# Patient Record
Sex: Female | Born: 1970 | Race: White | Hispanic: No | Marital: Married | Smoking: Never smoker
Health system: Southern US, Community
[De-identification: ages and names within clinical notes are randomized; demographics above are authoritative.]

## PROBLEM LIST (undated history)

## (undated) DIAGNOSIS — A6000 Herpesviral infection of urogenital system, unspecified: Secondary | ICD-10-CM

## (undated) DIAGNOSIS — H9190 Unspecified hearing loss, unspecified ear: Secondary | ICD-10-CM

## (undated) DIAGNOSIS — E785 Hyperlipidemia, unspecified: Secondary | ICD-10-CM

## (undated) DIAGNOSIS — Z803 Family history of malignant neoplasm of breast: Secondary | ICD-10-CM

## (undated) DIAGNOSIS — F4321 Adjustment disorder with depressed mood: Secondary | ICD-10-CM

## (undated) DIAGNOSIS — K219 Gastro-esophageal reflux disease without esophagitis: Secondary | ICD-10-CM

## (undated) DIAGNOSIS — R569 Unspecified convulsions: Secondary | ICD-10-CM

## (undated) HISTORY — PX: PLANTAR FASCIA RELEASE: SHX2239

## (undated) HISTORY — DX: Family history of malignant neoplasm of breast: Z80.3

## (undated) HISTORY — DX: Gastro-esophageal reflux disease without esophagitis: K21.9

## (undated) HISTORY — DX: Unspecified hearing loss, unspecified ear: H91.90

## (undated) HISTORY — PX: FOOT NEUROMA SURGERY: SHX646

## (undated) HISTORY — DX: Hyperlipidemia, unspecified: E78.5

## (undated) HISTORY — PX: UPPER GASTROINTESTINAL ENDOSCOPY: SHX188

## (undated) HISTORY — PX: COLONOSCOPY: SHX174

## (undated) HISTORY — DX: Unspecified convulsions: R56.9

## (undated) HISTORY — DX: Adjustment disorder with depressed mood: F43.21

## (undated) HISTORY — PX: APPENDECTOMY: SHX54

## (undated) HISTORY — DX: Herpesviral infection of urogenital system, unspecified: A60.00

---

## 1998-02-02 ENCOUNTER — Other Ambulatory Visit: Admission: RE | Admit: 1998-02-02 | Discharge: 1998-02-02 | Payer: Self-pay | Admitting: Obstetrics & Gynecology

## 1999-02-04 ENCOUNTER — Other Ambulatory Visit: Admission: RE | Admit: 1999-02-04 | Discharge: 1999-02-04 | Payer: Self-pay | Admitting: Obstetrics & Gynecology

## 1999-11-25 ENCOUNTER — Other Ambulatory Visit: Admission: RE | Admit: 1999-11-25 | Discharge: 1999-11-25 | Payer: Self-pay | Admitting: Obstetrics and Gynecology

## 2000-07-03 ENCOUNTER — Inpatient Hospital Stay (HOSPITAL_COMMUNITY): Admission: AD | Admit: 2000-07-03 | Discharge: 2000-07-05 | Payer: Self-pay | Admitting: Obstetrics & Gynecology

## 2000-07-07 ENCOUNTER — Encounter: Admission: RE | Admit: 2000-07-07 | Discharge: 2000-07-17 | Payer: Self-pay | Admitting: Obstetrics and Gynecology

## 2002-08-13 ENCOUNTER — Other Ambulatory Visit: Admission: RE | Admit: 2002-08-13 | Discharge: 2002-08-13 | Payer: Self-pay | Admitting: Obstetrics and Gynecology

## 2003-09-08 ENCOUNTER — Other Ambulatory Visit: Admission: RE | Admit: 2003-09-08 | Discharge: 2003-09-08 | Payer: Self-pay | Admitting: Obstetrics and Gynecology

## 2004-09-29 ENCOUNTER — Other Ambulatory Visit: Admission: RE | Admit: 2004-09-29 | Discharge: 2004-09-29 | Payer: Self-pay | Admitting: Obstetrics and Gynecology

## 2004-10-21 ENCOUNTER — Ambulatory Visit (HOSPITAL_COMMUNITY): Admission: RE | Admit: 2004-10-21 | Discharge: 2004-10-21 | Payer: Self-pay | Admitting: Obstetrics and Gynecology

## 2005-12-13 ENCOUNTER — Other Ambulatory Visit: Admission: RE | Admit: 2005-12-13 | Discharge: 2005-12-13 | Payer: Self-pay | Admitting: Obstetrics and Gynecology

## 2007-04-17 ENCOUNTER — Ambulatory Visit (HOSPITAL_BASED_OUTPATIENT_CLINIC_OR_DEPARTMENT_OTHER): Admission: RE | Admit: 2007-04-17 | Discharge: 2007-04-17 | Payer: Self-pay | Admitting: Orthopedic Surgery

## 2009-03-13 ENCOUNTER — Ambulatory Visit: Payer: Self-pay | Admitting: Diagnostic Radiology

## 2009-03-13 ENCOUNTER — Observation Stay (HOSPITAL_COMMUNITY): Admission: EM | Admit: 2009-03-13 | Discharge: 2009-03-14 | Payer: Self-pay | Admitting: Emergency Medicine

## 2009-03-13 ENCOUNTER — Encounter: Payer: Self-pay | Admitting: Emergency Medicine

## 2009-03-14 ENCOUNTER — Encounter (INDEPENDENT_AMBULATORY_CARE_PROVIDER_SITE_OTHER): Payer: Self-pay | Admitting: General Surgery

## 2009-09-14 ENCOUNTER — Encounter: Admission: RE | Admit: 2009-09-14 | Discharge: 2009-09-14 | Payer: Self-pay | Admitting: Family Medicine

## 2009-09-24 ENCOUNTER — Other Ambulatory Visit: Admission: RE | Admit: 2009-09-24 | Discharge: 2009-09-24 | Payer: Self-pay | Admitting: Gastroenterology

## 2010-08-01 ENCOUNTER — Encounter: Payer: Self-pay | Admitting: Family Medicine

## 2010-10-15 LAB — URINALYSIS, ROUTINE W REFLEX MICROSCOPIC
Bilirubin Urine: NEGATIVE
Glucose, UA: NEGATIVE mg/dL
Ketones, ur: NEGATIVE mg/dL
Nitrite: NEGATIVE
Protein, ur: NEGATIVE mg/dL

## 2010-10-15 LAB — COMPREHENSIVE METABOLIC PANEL
ALT: 11 U/L (ref 0–35)
AST: 23 U/L (ref 0–37)
Albumin: 4.4 g/dL (ref 3.5–5.2)
Chloride: 105 mEq/L (ref 96–112)
Creatinine, Ser: 0.9 mg/dL (ref 0.4–1.2)
Glucose, Bld: 106 mg/dL — ABNORMAL HIGH (ref 70–99)
Potassium: 3.9 mEq/L (ref 3.5–5.1)
Sodium: 143 mEq/L (ref 135–145)

## 2010-10-15 LAB — CBC
HCT: 37.5 % (ref 36.0–46.0)
MCHC: 35.4 g/dL (ref 30.0–36.0)
MCV: 96.4 fL (ref 78.0–100.0)
Platelets: 273 10*3/uL (ref 150–400)
WBC: 10 10*3/uL (ref 4.0–10.5)

## 2010-10-15 LAB — LIPASE, BLOOD: Lipase: 86 U/L (ref 23–300)

## 2010-10-15 LAB — DIFFERENTIAL
Basophils Absolute: 0 10*3/uL (ref 0.0–0.1)
Basophils Relative: 0 % (ref 0–1)
Eosinophils Relative: 1 % (ref 0–5)
Monocytes Relative: 6 % (ref 3–12)
Neutrophils Relative %: 70 % (ref 43–77)

## 2010-10-15 LAB — URINE MICROSCOPIC-ADD ON

## 2010-10-15 LAB — PREGNANCY, URINE: Preg Test, Ur: NEGATIVE

## 2010-11-23 NOTE — Op Note (Signed)
NAMESHARMILA, WROBLESKI              ACCOUNT NO.:  [id number]   MEDICAL RECORD NO.:  [id number]          PATIENT TYPE:  AMB   LOCATION:  DSC                          FACILITY:  [facility]   PHYSICIAN:  Leonides Grills, M.D.     DATE OF BIRTH:  October 22, 1970   DATE OF PROCEDURE:  04/17/2007  DATE OF DISCHARGE:                               OPERATIVE REPORT   PREOPERATIVE DIAGNOSES:  1. Left chronic plantar fasciitis.  2. Left tight gastroc.   POSTOPERATIVE DIAGNOSES:  1. Left chronic plantar fasciitis.  2. Left tight gastroc.   OPERATION:  1. Left Topaz application plantar fascia.  2. Left gastroc slide.   ANESTHESIA:  General.   SURGEON:  Leonides Grills, MD   ASSISTANT:  Evlyn Kanner, P.A.   ESTIMATED BLOOD LOSS:  Minimal.   TOURNIQUET:  None.   COMPLICATIONS:  None.   DISPOSITION:  Stable to PR.   INDICATIONS:  This is a 40 year old female with longstanding left  plantar fasciitis that was resistant to conservative management.  She  was consented for the above procedure.  All risks which include  infection, nerve or vessel injury, persistent pain, worsening pain,  prolonged recovery, weakness, plantar fascial rupture were all  explained.  Questions were encouraged and answered.   OPERATION:  The patient brought to the operating room and placed in  supine position after adequate general endotracheal tube anesthesia was  administered as well as Ancef gram IV piggyback.  The left lower  extremity was then prepped and draped in a sterile manner.  No  tourniquet was used.  A longitudinal incision over the medial aspect  gastrocnemius musculotendinous junction was made.  Dissection was  carried down through skin.  Hemostasis was obtained.  Fascia was opened  in line with the incision.  Conjoined region was then developed between  the gastroc and soleus.  Soft tissue including the sural nerve then  elevated off the posterior aspect gastrocnemius.  The gastrocnemius then  released  with a curved Mayo scissors protecting sural nerve posteriorly.  This had excellent release.  Tight gastroc area was copiously irrigated  with normal saline.  Subcu was closed with 3-0 Vicryl.  Skin was closed  with 4-0 Monocryl subcuticular stitch.  Steri-Strips were applied.  We  then made a grid of 2 mm holes 5 mm apart over the area of maximal  tenderness over the medial tubercle of the left calcaneus.  This was a  grid of about 3 x 3 cm and through these holes a Topaz radiofrequency  wand was applied to the plantar fascia and the application was done to  the plantar fascia.  Once this was done sterile dressing was applied.  Cam walker boot was applied.  The patient was stable to PR.      Leonides Grills, M.D.  Electronically Signed     PB/MEDQ  D:  04/17/2007  T:  04/17/2007  Job:  [id number]

## 2010-11-26 NOTE — H&P (Signed)
[facility]  Patient:    Misty Hensley, Misty Hensley                       MRN: [id number] Adm. Date:  07/03/00 Attending:  Janine Limbo, M.D. Dictator:   Nigel Bridgeman, C.N.M.                         History and Physical  HISTORY OF PRESENT ILLNESS:   Ms. Vancleve is a 40 year old, gravida 1, para 0 at 39-6/7ths weeks who presents with uterine contractions all day, now q. 5 minutes x 2 hours.  Patient denies any leaking or bleeding.  She reports positive fetal movement.  Cervix has been 1 cm in the office.  Pregnancy has been remarkable for:  1. 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   2. 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  3. ASA allergy.  PRENATAL LABORATORIES:        Blood type is A positive, Rh antibody negative, VDRL nonreactive, rubella titer positive, hepatitis B surface antigen negative, HIV nonreactive.  Toxoplasmosis titers were negative.  GC and Chlamydia cultures were negative.  Pap was normal.  Glucose challenge was elevated.  Three-hour GGT was within normal limits.  AFP was normal. Hemoglobin upon entering the practice was 13.7.  It was 10.5 at 29 weeks. Group B strep culture was negative.  EDC of 07/04/00 was established by ultrasound at 17 weeks.  This was a discrepancy from he EDC of LMP OF 06/23/00, but this was confirmed by ultrasound.  HISTORY OF PRESENT PREGNANCY:                    Patient entered care at approximately 9 weeks, had a normal AFP.  AFP was repeated when [facility] was altered and it was still within normal limits.  She had an elevated one-hour GGT at 152 which was followed up with a three-hour GGT.  This was normal.  The rest of her pregnancy was uncomplicated.  OBSTETRICAL HISTORY:          Patient is a primigravida.  PAST MEDICAL HISTORY:         She was on Ortho-Novum 135 with her last pill in July.  She reports the usual childhood illnesses with a history of Chlamydia in 1988.  She has a history of HSV diagnosis in 1993.   She has a history of yeast infections.  She had epilepsy diagnosed at 40 years old but grew out of it at age 89.  MEDICATION ALLERGIES:         ASPIRIN.  She has hives but can take Advil.  FAMILY HISTORY:               Her father is on blood pressure medication.  Her paternal grandmother is on blood pressure medications.  Her sister has varicosities.  Her maternal grandmother had breast cancer.  Her maternal grandfather had lung cancer.  SOCIAL HISTORY:               Patient is married, is the father of the baby. He is involved and supportive.  His name is Verdon Cummins.  The patient is graduate educated and is employed as a Interior and spatial designer.  Her husband is also college educated. He is employed in [employer].  She is Caucasian, of the [employer].  She has been followed by the physician service at [facility].  She denies any alcohol, drug, or  tobacco use during this pregnancy.  PHYSICAL EXAMINATION:  HEENT:                        Within normal limits.  LUNGS:                        Bilateral breath sounds are clear.  HEART:                        Regular rate and rhythm without murmur.  BREASTS:                      Soft and nontender.  ABDOMEN:                      Fundal height is approximately 38 cm, estimated fetal weight 7-1/2 pounds.  Uterine contractions are every 5 to 6 minutes, of mild quality.  PELVIC:                       Cervix was examined initially with 2 cm, 80% vertex, at a -2 station, with an intact bag of water, with small show noted. After ambulation, her cervix is now 4 to 5 cm, 90% vertex, at a -1 station with bulging bag of water and positive bloody show.  Good heart rate is reactive with no decelerations.  EXTREMITIES:                  Deep tendon reflexes are 2+ without clonus. There is a trace edema noted.  IMPRESSION:                   1. Intrauterine pregnancy at 39-6/7 weeks.                               2. Early labor.                               3.  Negative group B streptococcus.                               4. History of herpes simplex virus with no                                  lesions noted at present.  PLAN:                         1. Admit to birthing suite per consult with                                  Dr. Janine Limbo as the attending                                  physician.                               2. Routine physician orders.  3. Dr. Stefano Gaul plans artificial rupture of                                  membranes at a later time p.r.n.                               4. Patient desires epidural when appropriate.                                  The patient was recommended to wait at this                                  time until contractions were more frequent                                  and intense. DD:  07/03/00 TD:  07/03/00 Job: 2013 [id number]

## 2011-09-01 ENCOUNTER — Other Ambulatory Visit: Payer: Self-pay | Admitting: Gastroenterology

## 2011-09-26 ENCOUNTER — Encounter (HOSPITAL_COMMUNITY)
Admission: RE | Disposition: A | Discharge status: Hospice | Payer: Self-pay | Source: Ambulatory Visit | Attending: Gastroenterology

## 2011-09-26 ENCOUNTER — Ambulatory Visit (HOSPITAL_COMMUNITY)
Admission: RE | Admit: 2011-09-26 | Discharge: 2011-09-26 | Disposition: A | Discharge status: Hospice | Payer: BC Managed Care – PPO | Source: Ambulatory Visit | Attending: Gastroenterology | Admitting: Gastroenterology

## 2011-09-26 DIAGNOSIS — R1310 Dysphagia, unspecified: Secondary | ICD-10-CM | POA: Insufficient documentation

## 2011-09-26 HISTORY — PX: ESOPHAGEAL MANOMETRY: SHX5429

## 2011-09-26 SURGERY — MANOMETRY, ESOPHAGUS
Anesthesia: Choice

## 2011-09-26 MED ORDER — LIDOCAINE VISCOUS 2 % MT SOLN
OROMUCOSAL | Status: AC
  Filled 2011-09-26: qty 15

## 2011-09-27 ENCOUNTER — Encounter (HOSPITAL_COMMUNITY): Payer: Self-pay

## 2011-09-27 ENCOUNTER — Encounter (HOSPITAL_COMMUNITY): Payer: Self-pay | Admitting: Gastroenterology

## 2014-04-07 HISTORY — PX: ESOPHAGOGASTRODUODENOSCOPY: SHX1529

## 2014-07-11 HISTORY — PX: OTHER SURGICAL HISTORY: SHX169

## 2015-05-30 HISTORY — PX: LAPAROSCOPIC HELLER MYOTOMY: SUR780

## 2017-01-17 ENCOUNTER — Emergency Department (HOSPITAL_BASED_OUTPATIENT_CLINIC_OR_DEPARTMENT_OTHER): Payer: BC Managed Care – PPO

## 2017-01-17 ENCOUNTER — Emergency Department (HOSPITAL_BASED_OUTPATIENT_CLINIC_OR_DEPARTMENT_OTHER)
Admission: EM | Admit: 2017-01-17 | Discharge: 2017-01-17 | Disposition: A | Discharge status: Home / Self Care | Payer: BC Managed Care – PPO | Attending: Emergency Medicine | Admitting: Emergency Medicine

## 2017-01-17 ENCOUNTER — Encounter (HOSPITAL_BASED_OUTPATIENT_CLINIC_OR_DEPARTMENT_OTHER): Payer: Self-pay | Admitting: Emergency Medicine

## 2017-01-17 DIAGNOSIS — R0789 Other chest pain: Secondary | ICD-10-CM | POA: Diagnosis not present

## 2017-01-17 DIAGNOSIS — R079 Chest pain, unspecified: Secondary | ICD-10-CM | POA: Diagnosis present

## 2017-01-17 DIAGNOSIS — R8290 Unspecified abnormal findings in urine: Secondary | ICD-10-CM | POA: Insufficient documentation

## 2017-01-17 LAB — COMPREHENSIVE METABOLIC PANEL
ALBUMIN: 4.2 g/dL (ref 3.5–5.0)
ALT: 23 U/L (ref 14–54)
ANION GAP: 8 (ref 5–15)
AST: 25 U/L (ref 15–41)
Alkaline Phosphatase: 43 U/L (ref 38–126)
BUN: 14 mg/dL (ref 6–20)
CHLORIDE: 104 mmol/L (ref 101–111)
CO2: 26 mmol/L (ref 22–32)
Calcium: 8.9 mg/dL (ref 8.9–10.3)
Creatinine, Ser: 0.86 mg/dL (ref 0.44–1.00)
GFR calc Af Amer: >60 mL/min (ref >60)
GFR calc non Af Amer: >60 mL/min (ref >60)
GLUCOSE: 102 mg/dL — AB (ref 65–99)
POTASSIUM: 3.7 mmol/L (ref 3.5–5.1)
SODIUM: 138 mmol/L (ref 135–145)
TOTAL PROTEIN: 7.1 g/dL (ref 6.5–8.1)
Total Bilirubin: 0.5 mg/dL (ref 0.3–1.2)

## 2017-01-17 LAB — CBC WITH DIFFERENTIAL/PLATELET
BASOS PCT: 0 %
Basophils Absolute: 0 10*3/uL (ref 0.0–0.1)
EOS ABS: 0.2 10*3/uL (ref 0.0–0.7)
Eosinophils Relative: 4 %
HCT: 40 % (ref 36.0–46.0)
Hemoglobin: 13.1 g/dL (ref 12.0–15.0)
Lymphocytes Relative: 34 %
Lymphs Abs: 1.8 10*3/uL (ref 0.7–4.0)
MCH: 32.4 pg (ref 26.0–34.0)
MCHC: 32.8 g/dL (ref 30.0–36.0)
MCV: 99 fL (ref 78.0–100.0)
MONO ABS: 0.5 10*3/uL (ref 0.1–1.0)
MONOS PCT: 9 %
Neutro Abs: 2.7 10*3/uL (ref 1.7–7.7)
Neutrophils Relative %: 53 %
PLATELETS: 262 10*3/uL (ref 150–400)
RBC: 4.04 MIL/uL (ref 3.87–5.11)
RDW: 12.7 % (ref 11.5–15.5)
WBC: 5.1 10*3/uL (ref 4.0–10.5)

## 2017-01-17 LAB — URINALYSIS, ROUTINE W REFLEX MICROSCOPIC
Bilirubin Urine: NEGATIVE
Glucose, UA: NEGATIVE mg/dL
Ketones, ur: NEGATIVE mg/dL
Nitrite: NEGATIVE
PROTEIN: NEGATIVE mg/dL
Specific Gravity, Urine: 1.019 (ref 1.005–1.030)
pH: 7 (ref 5.0–8.0)

## 2017-01-17 LAB — URINALYSIS, MICROSCOPIC (REFLEX)

## 2017-01-17 LAB — D-DIMER, QUANTITATIVE: D-Dimer, Quant: <0.27 ug/mL-FEU (ref 0.00–0.50)

## 2017-01-17 LAB — TROPONIN I: Troponin I: <0.03 ng/mL (ref <0.03)

## 2017-01-17 LAB — LIPASE, BLOOD: LIPASE: 28 U/L (ref 11–51)

## 2017-01-17 MED ORDER — KETOROLAC TROMETHAMINE 30 MG/ML IJ SOLN
60.0000 mg | Freq: Once | INTRAMUSCULAR | Status: DC
  Filled 2017-01-17: qty 2

## 2017-01-17 MED ORDER — KETOROLAC TROMETHAMINE 30 MG/ML IJ SOLN
30.0000 mg | Freq: Once | INTRAMUSCULAR | Status: AC
  Administered 2017-01-17: 30 mg via INTRAVENOUS

## 2017-01-17 MED ORDER — METHOCARBAMOL 500 MG PO TABS: 500.0000 mg | ORAL_TABLET | Freq: Three times a day (TID) | ORAL | 0 refills | Status: DC | PRN

## 2017-01-17 NOTE — Discharge Instructions (Addendum)
You may alternate Tylenol 1000 mg every 6 hours as needed for pain and Ibuprofen 800 mg every 8 hours as needed for pain.  Please take Ibuprofen with food. ° °

## 2017-01-17 NOTE — ED Provider Notes (Signed)
TIME SEEN: 4:16 AM  CHIEF COMPLAINT: Left chest pain  HPI: Patient is a 46 year old female with history of gallstones who presents emergency department with left lateral chest pain. States that she has had intermittent symptoms since June. States that pain was gone for several weeks and then came back last night. She states it woke her from sleep at 3 AM. Described as sharp in nature and worse with lying on that side, palpation, movement. She states it does make her feel she is short of breath like she cannot catch her breath. She denies any injury to this area or increased physical exertion. No fevers, cough, vomiting, diarrhea. No abdominal pain. No dysuria or hematuria. No history of PE or DVT. No lower extremity swelling or pain. No cardiac disease. Patient was seen by her primary care physician has had unremarkable labs including CBC, CMP, lipase, troponin and d-dimer. Also had an abdominal ultrasound which was unremarkable other than showing gallstones. She is not having a right upper quadrant abdominal pain. She does not feel that this changes with eating food. No rash or other lesions noted to this area.  ROS: See HPI Constitutional: no fever  Eyes: no drainage  ENT: no runny nose   Cardiovascular:   chest pain  Resp:  SOB  GI: no vomiting GU: no dysuria Integumentary: no rash  Allergy: no hives  Musculoskeletal: no leg swelling  Neurological: no slurred speech ROS otherwise negative  PAST MEDICAL HISTORY/PAST SURGICAL HISTORY:  History reviewed. No pertinent past medical history.  MEDICATIONS:  Prior to Admission medications   Not on File    ALLERGIES:  Allergies  Allergen Reactions  . Aspirin Other (See Comments)    "I get itchy on my back"    SOCIAL HISTORY:  Social History  Substance Use Topics  . Smoking status: Never Smoker  . Smokeless tobacco: Never Used  . Alcohol use No    FAMILY HISTORY: No family history on file.  EXAM: BP (!) 150/91 (BP Location:  Right Arm)   Pulse 64   Temp 98.2 F (36.8 C) (Oral)   Resp 19   Ht 5\' 4"  (1.626 m)   Wt 67.1 kg (148 lb)   SpO2 100%   BMI 25.40 kg/m  CONSTITUTIONAL: Alert and oriented and responds appropriately to questions. Well-appearing; well-nourished HEAD: Normocephalic EYES: Conjunctivae clear, pupils appear equal, EOMI ENT: normal nose; moist mucous membranes NECK: Supple, no meningismus, no nuchal rigidity, no LAD  CARD: RRR; S1 and S2 appreciated; no murmurs, no clicks, no rubs, no gallops CHEST:  Chest wall is tender to palpation along the left lateral lower ribs.  No crepitus, ecchymosis, erythema, warmth, rash or other lesions present.   RESP: Normal chest excursion without splinting or tachypnea; breath sounds clear and equal bilaterally; no wheezes, no rhonchi, no rales, no hypoxia or respiratory distress, speaking full sentences ABD/GI: Normal bowel sounds; non-distended; soft, non-tender, no rebound, no guarding, no peritoneal signs, no hepatosplenomegaly, negative Murphy sign BACK:  The back appears normal and is non-tender to palpation, there is no CVA tenderness EXT: Normal ROM in all joints; non-tender to palpation; no edema; normal capillary refill; no cyanosis, no calf tenderness or swelling    SKIN: Normal color for age and race; warm; no rash NEURO: Moves all extremities equally PSYCH: The patient's mood and manner are appropriate. Grooming and personal hygiene are appropriate.  MEDICAL DECISION MAKING: Patient here with left lateral chest wall pain. Seems to be musculoskeletal in nature but given complaints  of feeling short of breath, will obtain cardiac labs, d-dimer and chest x-ray. We'll also obtain EKG. We'll give Toradol for pain control. She did drive herself to the emergency department. Discussed with her that I feel this is unlikely her gallstones causing her symptoms and she agrees. Is not associated with food and she has no tenderness in the right upper quadrant and has  a negative Murphy sign. She has no other abdominal tenderness on exam. I do not feel she needs a CT of her abdomen and pelvis at this time. We will check LFTs and lipase however today. We'll also obtain urinalysis given she does have some component of left flank pain but doubt kidney stone, pyelonephritis given pain is reproducible with palpation.  ED PROGRESS: 6:00 AM  Pt's labs are unremarkable including normal LFTs, lipase, troponin and negative d-dimer. Chest x-ray shows no acute abnormality. No rib fractures. Pain improved with Toradol. She declines any further pain medication. Urinalysis shows trace hemoglobin and small leukocytes and many bacteria but 0-5 rbc's, 0-5 white blood cells. She is not having dysuria, urgency, frequency and no flank pain. Pain seems to be more of the lateral left chest wall. I did send a urine culture but I do not think this is the cause of her symptoms. I think that this is musculoskeletal in nature. Have recommended that she alternate Tylenol and Motrin for pain and will discharge with prescription for Robaxin. Recommended close follow-up with her primary care provider. If patient is comfortable with this plan.   At this time, I do not feel there is any life-threatening condition present. I have reviewed and discussed all results (EKG, imaging, lab, urine as appropriate) and exam findings with patient/family. I have reviewed nursing notes and appropriate previous records.  I feel the patient is safe to be discharged home without further emergent workup and can continue workup as an outpatient as needed. Discussed usual and customary return precautions. Patient/family verbalize understanding and are comfortable with this plan.  Outpatient follow-up has been provided if needed. All questions have been answered.     EKG Interpretation  Date/Time:  Tuesday January 17 2017 04:34:25 EDT Ventricular Rate:  64 PR Interval:    QRS Duration: 91 QT Interval:  422 QTC  Calculation: 436 R Axis:   8 Text Interpretation:  Sinus rhythm Low voltage, precordial leads Confirmed by Ward, Cyril Mourning [phone]) on 01/17/2017 5:03:01 AM         Ward, Delice Bison, DO 01/17/17 [id number]

## 2017-01-17 NOTE — ED Triage Notes (Signed)
L sided rib pain since last night. Makes breathing painful, but denies SOB. Pt states has happened before and u/s showed gall stones.

## 2017-01-18 LAB — URINE CULTURE

## 2019-06-17 ENCOUNTER — Encounter: Payer: Self-pay | Admitting: Gastroenterology

## 2019-07-11 ENCOUNTER — Telehealth (INDEPENDENT_AMBULATORY_CARE_PROVIDER_SITE_OTHER): Payer: BC Managed Care – PPO | Admitting: Gastroenterology

## 2019-07-11 ENCOUNTER — Other Ambulatory Visit: Payer: Self-pay

## 2019-07-11 ENCOUNTER — Encounter: Payer: Self-pay | Admitting: Gastroenterology

## 2019-07-11 VITALS — Ht 64.5 in | Wt 149.0 lb

## 2019-07-11 DIAGNOSIS — Z1211 Encounter for screening for malignant neoplasm of colon: Secondary | ICD-10-CM

## 2019-07-11 DIAGNOSIS — K220 Achalasia of cardia: Secondary | ICD-10-CM

## 2019-07-11 DIAGNOSIS — Z1159 Encounter for screening for other viral diseases: Secondary | ICD-10-CM

## 2019-07-11 DIAGNOSIS — R1310 Dysphagia, unspecified: Secondary | ICD-10-CM

## 2019-07-11 MED ORDER — CLENPIQ 10-3.5-12 MG-GM -GM/160ML PO SOLN: 1.0000 | Freq: Once | ORAL | 0 refills | Status: AC

## 2019-07-11 MED ORDER — ESOMEPRAZOLE MAGNESIUM 40 MG PO CPDR: 40.0000 mg | DELAYED_RELEASE_CAPSULE | Freq: Every day | ORAL | 6 refills | Status: AC

## 2019-07-11 NOTE — Progress Notes (Signed)
Chief Complaint: Dysphagia/screening  Referring Provider:  Berkley Harvey, NP      ASSESSMENT AND PLAN;   #1.  H/O type II achalasia s/p Lap Heller's myotomy with partial Princess Perna) fundoplication Jan [id number] (Dr Garner Nash), now with occ dysphagia and GERD.  #2.  Colorectal cancer screening.  Plan: -Nexium 40 mg p.o. once a day, 30, 6 refills -Ba swallow with barium tablet. (If timed barium swallow can be done, that would be great) -EGD/colonoscopy with Clenpiq at [facility].  I have discussed risks and benefits.  She wishes to proceed.   HPI:    Misty Hensley is a 48 y.o. female  For age based colorectal cancer screening Also has been having dysphagia mostly to solids-mostly to rice, quinoa, khus-khus. Mid-chest. Has been having occasional heartburn.  As below she has history of dysphagia, diagnosed with type II achalasia and underwent Heller's myotomy with partial fundoplication [id number].   No nausea, vomiting, regurgitation, odynophagia. No significant diarrhea or constipation.  No melena or hematochezia. No unintentional weight loss. No abdominal pain.  On review of previous records: Had dysphagia, EGD 04/11/2014 at [facility]: Distal eso stric s/p dil 32mm with negative eso Bx for EoE. Eso manometry 04/2014: Type II achalasia. Mean IRP 41.5 mm.  Past Medical History:  Diagnosis Date  . Adjustment disorder with depressed mood   . Family history of breast cancer in mother   . Genital herpes simplex   . GERD (gastroesophageal reflux disease)   . Hearing loss     Past Surgical History:  Procedure Laterality Date  . achalasia surgery  2016  . APPENDECTOMY    . ESOPHAGEAL MANOMETRY  09/26/2011   Procedure: ESOPHAGEAL MANOMETRY (EM);  Surgeon: Lear Ng, MD;  Location: [facility];  Service: Endoscopy;  Laterality: N/A;  . ESOPHAGOGASTRODUODENOSCOPY  04/07/2014   [facility]  . FOOT NEUROMA SURGERY    . LAPAROSCOPIC HELLER MYOTOMY  05/30/2015  . PLANTAR FASCIA  RELEASE Bilateral     Family History  Problem Relation Age of Onset  . Breast cancer Mother 1  . Coronary artery disease Father   . Hyperlipidemia Father   . Breast cancer Maternal Grandmother 39  . Hyperlipidemia Paternal Grandfather   . Colon cancer Neg Hx   . Esophageal cancer Neg Hx     Social History   Tobacco Use  . Smoking status: Never Smoker  . Smokeless tobacco: Never Used  Substance Use Topics  . Alcohol use: No  . Drug use: No    Current Outpatient Medications  Medication Sig Dispense Refill  . ibuprofen (ADVIL) 600 MG tablet Take 600 mg by mouth as needed.    . Multiple Vitamin (MULTIVITAMIN) tablet Take 1 tablet by mouth daily.     No current facility-administered medications for this visit.    Allergies  Allergen Reactions  . Aspirin Other (See Comments)    "I get itchy on my back"  . Penicillins Diarrhea    Review of Systems:  Constitutional: Denies fever, chills, diaphoresis, appetite change and fatigue.  HEENT: Denies photophobia, eye pain, redness, hearing loss, ear pain, congestion, sore throat, rhinorrhea, sneezing, mouth sores, neck pain, neck stiffness and tinnitus.   Respiratory: Denies SOB, DOE, cough, chest tightness,  and wheezing.   Cardiovascular: Denies chest pain, palpitations and leg swelling.  Genitourinary: Denies dysuria, urgency, frequency, hematuria, flank pain and difficulty urinating.  Musculoskeletal: Denies myalgias, back pain, joint swelling, arthralgias and gait problem.  Skin: No rash.  Neurological: Denies dizziness,  seizures, syncope, weakness, light-headedness, numbness and headaches.  Hematological: Denies adenopathy. Easy bruising, personal or family bleeding history  Psychiatric/Behavioral: No anxiety or depression     Physical Exam:    Ht 5' 4.5" (1.638 m)   Wt 149 lb (67.6 kg)   BMI 25.18 kg/m  Wt Readings from Last 3 Encounters:  07/11/19 149 lb (67.6 kg)  01/17/17 148 lb (67.1 kg)  09/26/11 (P) 145 lb  (65.8 kg)   Constitutional:  Well-developed, in no acute distress. Psychiatric: Normal mood and affect. Behavior is normal. televisit  Data Reviewed: I have personally reviewed following labs and imaging studies  CBC: CBC Latest Ref Rng & Units 01/17/2017 03/13/2009 04/17/2007  WBC 4.0 - 10.5 K/uL 5.1 10.0 -  Hemoglobin 12.0 - 15.0 g/dL 13.1 13.3 13.8  Hematocrit 36.0 - 46.0 % 40.0 37.5 -  Platelets 150 - 400 K/uL 262 273 -    CMP: CMP Latest Ref Rng & Units 01/17/2017 03/13/2009  Glucose 65 - 99 mg/dL 102(H) 106(H)  BUN 6 - 20 mg/dL 14 16  Creatinine 0.44 - 1.00 mg/dL 0.86 0.9  Sodium 135 - 145 mmol/L 138 143  Potassium 3.5 - 5.1 mmol/L 3.7 3.9  Chloride 101 - 111 mmol/L 104 105  CO2 22 - 32 mmol/L 26 29  Calcium 8.9 - 10.3 mg/dL 8.9 9.5  Total Protein 6.5 - 8.1 g/dL 7.1 7.8  Total Bilirubin 0.3 - 1.2 mg/dL 0.5 0.3  Alkaline Phos 38 - 126 U/L 43 60  AST 15 - 41 U/L 25 23  ALT 14 - 54 U/L 23 11  Extensive records were reviewed from care everywhere.  Total time spent on call, review of records: 40 min  I connected with  Misty Hensley on 07/13/19 by a video enabled telemedicine application and verified that I am speaking with the correct person.   I discussed the limitations of evaluation and management by telemedicine. The patient expressed understanding and agreed to proceed.    Carmell Austria, MD 07/11/2019, 10:40 AM  Cc: Berkley Harvey, NP

## 2019-07-11 NOTE — Patient Instructions (Addendum)
If you are age 50 or older, your body mass index should be between 23-30. Your Body mass index is 25.18 kg/m. If this is out of the aforementioned range listed, please consider follow up with your Primary Care Provider.  If you are age 31 or younger, your body mass index should be between 19-25. Your Body mass index is 25.18 kg/m. If this is out of the aformentioned range listed, please consider follow up with your Primary Care Provider.   You have been scheduled for a Barium Esophogram at [facility] (1st floor of the hospital) on 07/22/19 at 9:30am. Please arrive 15 minutes prior to your appointment for registration. Make certain not to have anything to eat or drink 3 hours prior to your test. If you need to reschedule for any reason, please contact radiology at [phone] to do so. __________________________________________________________________ A barium swallow is an examination that concentrates on views of the esophagus. This tends to be a double contrast exam (barium and two liquids which, when combined, create a gas to distend the wall of the oesophagus) or single contrast (non-ionic iodine based). The study is usually tailored to your symptoms so a good history is essential. Attention is paid during the study to the form, structure and configuration of the esophagus, looking for functional disorders (such as aspiration, dysphagia, achalasia, motility and reflux) EXAMINATION You may be asked to change into a gown, depending on the type of swallow being performed. A radiologist and radiographer will perform the procedure. The radiologist will advise you of the type of contrast selected for your procedure and direct you during the exam. You will be asked to stand, sit or lie in several different positions and to hold a small amount of fluid in your mouth before being asked to swallow while the imaging is performed .In some instances you may be asked to swallow barium coated marshmallows  to assess the motility of a solid food bolus. The exam can be recorded as a digital or video fluoroscopy procedure. POST PROCEDURE It will take 1-2 days for the barium to pass through your system. To facilitate this, it is important, unless otherwise directed, to increase your fluids for the next 24-48hrs and to resume your normal diet.  This test typically takes about 30 minutes to perform. __________________________________________________________________________________  We have sent the following medications to your pharmacy for you to pick up at your convenience: Nexium Clenpiq  You have been scheduled for an endoscopy and colonoscopy. Please follow the written instructions given to you at your visit today. Please pick up your prep supplies at the pharmacy within the next 1-3 days. If you use inhalers (even only as needed), please bring them with you on the day of your procedure. Your physician has requested that you go to www.startemmi.com and enter the access code given to you at your visit today. This web site gives a general overview about your procedure. However, you should still follow specific instructions given to you by our office regarding your preparation for the procedure.  I have attached a Pre Procedure Patient Acknowledgement form and a prepaid envelope, please initial and sign form and mail back in envelope.    Thank you,  Dr. Jackquline Denmark

## 2019-07-22 ENCOUNTER — Ambulatory Visit (HOSPITAL_COMMUNITY): Payer: BC Managed Care – PPO

## 2019-07-24 ENCOUNTER — Ambulatory Visit (HOSPITAL_COMMUNITY)
Admission: RE | Admit: 2019-07-24 | Discharge: 2019-07-24 | Disposition: A | Discharge status: Home / Self Care | Payer: BC Managed Care – PPO | Source: Ambulatory Visit | Attending: Gastroenterology | Admitting: Gastroenterology

## 2019-07-24 ENCOUNTER — Other Ambulatory Visit: Payer: Self-pay

## 2019-07-24 DIAGNOSIS — R1310 Dysphagia, unspecified: Secondary | ICD-10-CM | POA: Diagnosis present

## 2019-07-24 DIAGNOSIS — K220 Achalasia of cardia: Secondary | ICD-10-CM | POA: Insufficient documentation

## 2019-07-30 ENCOUNTER — Encounter: Payer: Self-pay | Admitting: Gastroenterology

## 2019-08-09 ENCOUNTER — Ambulatory Visit (INDEPENDENT_AMBULATORY_CARE_PROVIDER_SITE_OTHER): Payer: Self-pay

## 2019-08-09 ENCOUNTER — Other Ambulatory Visit: Payer: Self-pay | Admitting: Gastroenterology

## 2019-08-09 ENCOUNTER — Other Ambulatory Visit: Payer: Self-pay

## 2019-08-09 DIAGNOSIS — Z1159 Encounter for screening for other viral diseases: Secondary | ICD-10-CM

## 2019-08-12 LAB — SARS CORONAVIRUS 2 (TAT 6-24 HRS): SARS Coronavirus 2: NEGATIVE

## 2019-08-13 ENCOUNTER — Other Ambulatory Visit: Payer: Self-pay

## 2019-08-13 ENCOUNTER — Encounter: Payer: Self-pay | Admitting: Gastroenterology

## 2019-08-13 ENCOUNTER — Ambulatory Visit (AMBULATORY_SURGERY_CENTER): Payer: BC Managed Care – PPO | Admitting: Gastroenterology

## 2019-08-13 VITALS — BP 118/73 | HR 64 | Temp 96.8°F | Resp 10 | Ht 64.0 in | Wt 149.0 lb

## 2019-08-13 DIAGNOSIS — R1319 Other dysphagia: Secondary | ICD-10-CM

## 2019-08-13 DIAGNOSIS — K219 Gastro-esophageal reflux disease without esophagitis: Secondary | ICD-10-CM | POA: Diagnosis not present

## 2019-08-13 DIAGNOSIS — D124 Benign neoplasm of descending colon: Secondary | ICD-10-CM

## 2019-08-13 DIAGNOSIS — R1310 Dysphagia, unspecified: Secondary | ICD-10-CM

## 2019-08-13 DIAGNOSIS — D125 Benign neoplasm of sigmoid colon: Secondary | ICD-10-CM | POA: Diagnosis not present

## 2019-08-13 DIAGNOSIS — K317 Polyp of stomach and duodenum: Secondary | ICD-10-CM

## 2019-08-13 DIAGNOSIS — Z1211 Encounter for screening for malignant neoplasm of colon: Secondary | ICD-10-CM

## 2019-08-13 DIAGNOSIS — K2950 Unspecified chronic gastritis without bleeding: Secondary | ICD-10-CM

## 2019-08-13 MED ORDER — SODIUM CHLORIDE 0.9 % IV SOLN: 500.0000 mL | Freq: Once | INTRAVENOUS | Status: DC

## 2019-08-13 NOTE — Op Note (Signed)
[facility] Patient Name: Misty Hensley Procedure Date: 08/13/2019 7:18 AM MRN: [id number] Endoscopist: Jackquline Denmark , MD Age: 49 Referring MD:  Date of Birth: 10/29/1970 Gender: Female Account #: [id number] Procedure:                Colonoscopy Indications:              Screening for colorectal malignant neoplasm Medicines:                Monitored Anesthesia Care Procedure:                Pre-Anesthesia Assessment:                           - Prior to the procedure, a History and Physical                            was performed, and patient medications and                            allergies were reviewed. The patient's tolerance of                            previous anesthesia was also reviewed. The risks                            and benefits of the procedure and the sedation                            options and risks were discussed with the patient.                            All questions were answered, and informed consent                            was obtained. Prior Anticoagulants: The patient has                            taken no previous anticoagulant or antiplatelet                            agents. ASA Grade Assessment: I - A normal, healthy                            patient. After reviewing the risks and benefits,                            the patient was deemed in satisfactory condition to                            undergo the procedure.                           After obtaining informed consent, the colonoscope  was passed under direct vision. Throughout the                            procedure, the patient's blood pressure, pulse, and                            oxygen saturations were monitored continuously. The                            Colonoscope was introduced through the anus and                            advanced to the 2 cm into the ileum. The                            Colonoscope was introduced through the  and advanced                            to the. The colonoscopy was performed without                            difficulty. The patient tolerated the procedure                            well. The quality of the bowel preparation was                            good. The terminal ileum, ileocecal valve,                            appendiceal orifice, and rectum were photographed. Scope In: 8:17:48 AM Scope Out: 8:33:02 AM Scope Withdrawal Time: 0 hours 11 minutes 37 seconds  Total Procedure Duration: 0 hours 15 minutes 14 seconds  Findings:                 A 2 mm polyp was found in the proximal descending                            colon. The polyp was sessile. The polyp was removed                            with a cold biopsy forceps. Resection and retrieval                            were complete.                           A 10 mm polyp was found in the mid sigmoid colon.                            The polyp was semi-pedunculated. The polyp was  removed with a hot snare. Resection and retrieval                            were complete.                           A few small-mouthed diverticula were found in the                            sigmoid colon and ascending colon. More so in the                            ascending colon. No endoscopic evidence of                            diverticulitis.                           Non-bleeding internal hemorrhoids were found during                            retroflexion. The hemorrhoids were small.                           The exam was otherwise without abnormality on                            direct and retroflexion views.                           The terminal ileum appeared normal. Complications:            No immediate complications. Estimated Blood Loss:     Estimated blood loss: none. Impression:               -Colonic polyps s/p polypectomy.                           -Mild pancolonic diverticulosis  predominantly in                            the right colon.                           -Otherwise normal colonoscopy to TI. Recommendation:           - Patient has a contact number available for                            emergencies. The signs and symptoms of potential                            delayed complications were discussed with the                            patient. Return to normal activities tomorrow.  Written discharge instructions were provided to the                            patient.                           - Resume previous diet.                           - Continue present medications.                           - Await pathology results.                           - Repeat colonoscopy for surveillance based on                            pathology results.                           - No ibuprofen, naproxen, or other non-steroidal                            anti-inflammatory drugs for 5 days after polyp                            removal. Jackquline Denmark, MD 08/13/2019 8:41:29 AM This report has been signed electronically.

## 2019-08-13 NOTE — Op Note (Signed)
[place] Patient Name: Misty Hensley Procedure Date: 08/13/2019 7:19 AM MRN: [id number] Endoscopist: Jackquline Denmark , MD Age: 49 Referring MD:  Date of Birth: 1971/02/27 Gender: Female Account #: [id number] Procedure:                Upper GI endoscopy Indications:              H/O type II achalasia s/p Lap Heller's myotomy with                            partial Princess Perna) fundoplication Jan [id number] (Dr                            Garner Nash), now with occ dysphagia and GERD. Neg                            barium swallow. Medicines:                Monitored Anesthesia Care Procedure:                Pre-Anesthesia Assessment:                           - Prior to the procedure, a History and Physical                            was performed, and patient medications and                            allergies were reviewed. The patient's tolerance of                            previous anesthesia was also reviewed. The risks                            and benefits of the procedure and the sedation                            options and risks were discussed with the patient.                            All questions were answered, and informed consent                            was obtained. Prior Anticoagulants: The patient has                            taken no previous anticoagulant or antiplatelet                            agents. ASA Grade Assessment: I - A normal, healthy                            patient. After reviewing the risks and benefits,  the patient was deemed in satisfactory condition to                            undergo the procedure.                           - Prior to the procedure, a History and Physical                            was performed, and patient medications and                            allergies were reviewed. The patient's tolerance of                            previous anesthesia was also reviewed. The risks             and benefits of the procedure and the sedation                            options and risks were discussed with the patient.                            All questions were answered, and informed consent                            was obtained. Prior Anticoagulants: The patient has                            taken no previous anticoagulant or antiplatelet                            agents. ASA Grade Assessment: I - A normal, healthy                            patient. After reviewing the risks and benefits,                            the patient was deemed in satisfactory condition to                            undergo the procedure.                           After obtaining informed consent, the endoscope was                            passed under direct vision. Throughout the                            procedure, the patient's blood pressure, pulse, and                            oxygen saturations were monitored continuously. The  Endoscope was introduced through the mouth, and                            advanced to the second part of duodenum. The upper                            GI endoscopy was accomplished without difficulty.                            The patient tolerated the procedure well. Scope In: Scope Out: Findings:                 The examined esophagus was mildly tortuous but                            normal. It was not dilated. Z-line was well-defined                            at 36 cm. A small salmon-colored island measuring 6                            mm was visualized just proximal to the GE junction.                            Best visualized by NBI. Biopsies were taken with a                            cold forceps for histology, directed by NBI. The GE                            junction was normal. Evidence of previous Heller's                            myotomy by history. It was decided, however, to                             proceed with dilation of the entire esophagus. The                            scope was withdrawn. Dilation was performed with a                            Maloney dilator with mild resistance at 50 Fr.                           Localized mild inflammation characterized by                            erythema was found in the gastric antrum. Biopsies                            were taken with a cold forceps for histology. The  retroflexed examination of cardia did reveal                            presence of partial fundoplication.                           Two 2 to 6 mm sessile polyps with no bleeding and                            no stigmata of recent bleeding were found in the                            gastric body. The polyp was removed with a cold                            biopsy forceps. Resection and retrieval were                            complete.                           The examined duodenum was normal. Complications:            No immediate complications. Estimated Blood Loss:     Estimated blood loss: none. Impression:               -Patent GE junction.                           -H/O achalasia s/p Heller's myotomy with partial                            fundoplication with dysphagia s/p empiric                            esophageal dilatation.                           -Incidental gastric polyps s/p polypectomy.                           -Mild gastritis. Recommendation:           - Patient has a contact number available for                            emergencies. The signs and symptoms of potential                            delayed complications were discussed with the                            patient. Return to normal activities tomorrow.                            Written discharge instructions were provided to the  patient.                           - Post dilatation diet.                           - Continue  Nexium 40 mg p.o. once a day.                           - Await pathology results.                           - Return to GI clinic in 12 weeks. If still with                            problems, will perform further work-up. Jackquline Denmark, MD 08/13/2019 8:51:34 AM This report has been signed electronically.

## 2019-08-13 NOTE — Progress Notes (Signed)
To PACU, VSS. Report to [email or web address] 

## 2019-08-13 NOTE — Patient Instructions (Signed)
Thank you for letting us take care of your healthcare needs today.  Please see handouts given to you on Diverticulosis, Polyps and the Post Dilation diet. No NSAIDS for 5 days. Continue Nexium 40 mg daily.     YOU HAD AN ENDOSCOPIC PROCEDURE TODAY AT [facility]:   Refer to the procedure report that was given to you for any specific questions about what was found during the examination.  If the procedure report does not answer your questions, please call your gastroenterologist to clarify.  If you requested that your care partner not be given the details of your procedure findings, then the procedure report has been included in a sealed envelope for you to review at your convenience later.  YOU SHOULD EXPECT: Some feelings of bloating in the abdomen. Passage of more gas than usual.  Walking can help get rid of the air that was put into your GI tract during the procedure and reduce the bloating. If you had a lower endoscopy (such as a colonoscopy or flexible sigmoidoscopy) you may notice spotting of blood in your stool or on the toilet paper. If you underwent a bowel prep for your procedure, you may not have a normal bowel movement for a few days.  Please Note:  You might notice some irritation and congestion in your nose or some drainage.  This is from the oxygen used during your procedure.  There is no need for concern and it should clear up in a day or so.  SYMPTOMS TO REPORT IMMEDIATELY:   Following lower endoscopy (colonoscopy or flexible sigmoidoscopy):  Excessive amounts of blood in the stool    Significant tenderness or worsening of abdominal pains  Swelling of the abdomen that is new, acute  Fever of 100F or higher   Following upper endoscopy (EGD)  Vomiting of blood or coffee ground material  New chest pain or pain under the shoulder blades  Painful or persistently difficult swallowing  New shortness of breath  Fever of 100F or higher  Black, tarry-looking  stools  For urgent or emergent issues, a gastroenterologist can be reached at any hour by calling [phone].   DIET:  Clear liquids for an hour to start at 9:45, Soft diet to start at 10:45 [place]. TOMORROW  you may proceed to your regular diet.  Drink plenty of fluids but you should avoid alcoholic beverages for 24 hours.  ACTIVITY:  You should plan to take it easy for the rest of today and you should NOT DRIVE or use heavy machinery until tomorrow (because of the sedation medicines used during the test).    FOLLOW UP: Our staff will call the number listed on your records 48-72 hours following your procedure to check on you and address any questions or concerns that you may have regarding the information given to you following your procedure. If we do not reach you, we will leave a message.  We will attempt to reach you two times.  During this call, we will ask if you have developed any symptoms of COVID 19. If you develop any symptoms (ie: fever, flu-like symptoms, shortness of breath, cough etc.) before then, please call [phone].  If you test positive for Covid 19 in the 2 weeks post procedure, please call and report this information to [place].    If any biopsies were taken you will be contacted by phone or by letter within the next 1-3 weeks.  Please call us at [phone] if you have not heard about the biopsies in 3 weeks.    SIGNATURES/CONFIDENTIALITY: You and/or your care partner have signed paperwork which will be entered into your electronic medical record.  These signatures attest to the fact that that the information above on your After Visit Summary has been reviewed and is understood.  Full responsibility of the confidentiality of this discharge information lies with you and/or your care-partner.

## 2019-08-13 NOTE — Progress Notes (Signed)
Pt's states no medical or surgical changes since previsit or office visit. 

## 2019-08-13 NOTE — Progress Notes (Signed)
Called to room to assist during endoscopic procedure.  Patient ID and intended procedure confirmed with present staff. Received instructions for my participation in the procedure from the performing physician.  

## 2019-08-15 ENCOUNTER — Telehealth: Payer: Self-pay

## 2019-08-15 NOTE — Telephone Encounter (Signed)
  Follow up Call-  Call back number 08/13/2019  Post procedure Call Back phone  # [phone]  Permission to leave phone message Yes  Some recent data might be hidden     Patient questions:  Do you have a fever, pain , or abdominal swelling? No. Pain Score  0 *  Have you tolerated food without any problems? Yes.    Have you been able to return to your normal activities? Yes.    Do you have any questions about your discharge instructions: Diet   No. Medications  No. Follow up visit  No.  Do you have questions or concerns about your Care? No.  Actions: * If pain score is 4 or above: No action needed, pain <4.  1. Have you developed a fever since your procedure? no  2.   Have you had an respiratory symptoms (SOB or cough) since your procedure? no  3.   Have you tested positive for COVID 19 since your procedure no  4.   Have you had any family members/close contacts diagnosed with the COVID 19 since your procedure?  no   If yes to any of these questions please route to Joylene John, RN and Alphonsa Gin, Therapist, sports.

## 2019-08-18 ENCOUNTER — Encounter: Payer: Self-pay | Admitting: Gastroenterology

## 2021-12-31 IMAGING — RF DG ESOPHAGUS
9 series · 16 of 24 positions shown · non-contrast
Comparison: 09/14/2009

CLINICAL DATA: History of achalasia with surgery 4-5 years ago.
Difficulty swallowing some foods. History gastroesophageal reflux
disease.

EXAM:
ESOPHOGRAM / BARIUM SWALLOW / BARIUM TABLET STUDY
TECHNIQUE: Combined double contrast and single contrast examination performed
using effervescent crystals, thick barium liquid, and thin barium
liquid. The patient was observed with fluoroscopy swallowing a 13 mm
barium sulphate tablet.
FLUOROSCOPY TIME:  Fluoroscopy Time:  1 minutes and 54 seconds
Radiation Exposure Index (if provided by the fluoroscopic device):
22.6 mGy
Number of Acquired Spot Images: 0

[Series 1: cp_standard · 0.51mm/px · 2 of 109 frames shown (1 of 9)]
[frame 17/109]
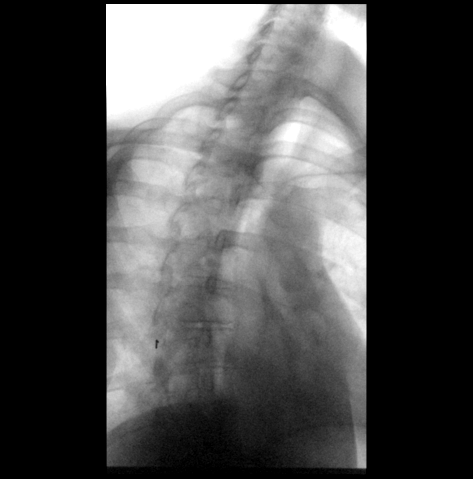
[frame 93/109]
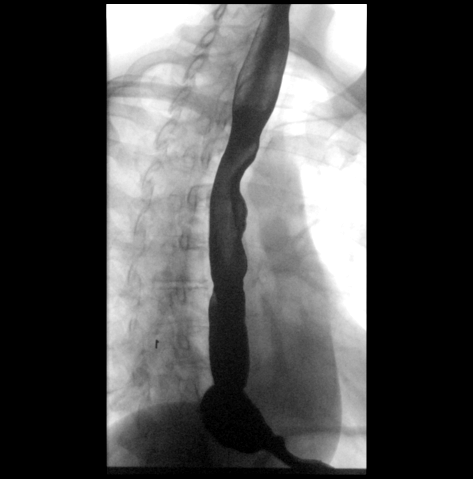

[Series 2: cp_standard · 0.51mm/px · 2 of 96 frames shown (2 of 9)]
[frame 4/96]
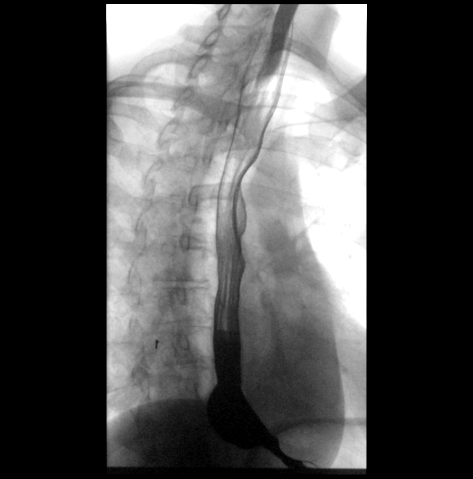
[frame 49/96]
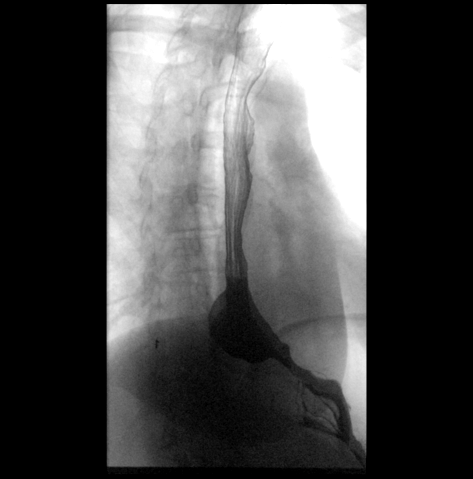

[Series 3: cp_standard · 0.25mm/px · 1 of 1 slices shown (3 of 9)]
[im 1/1]
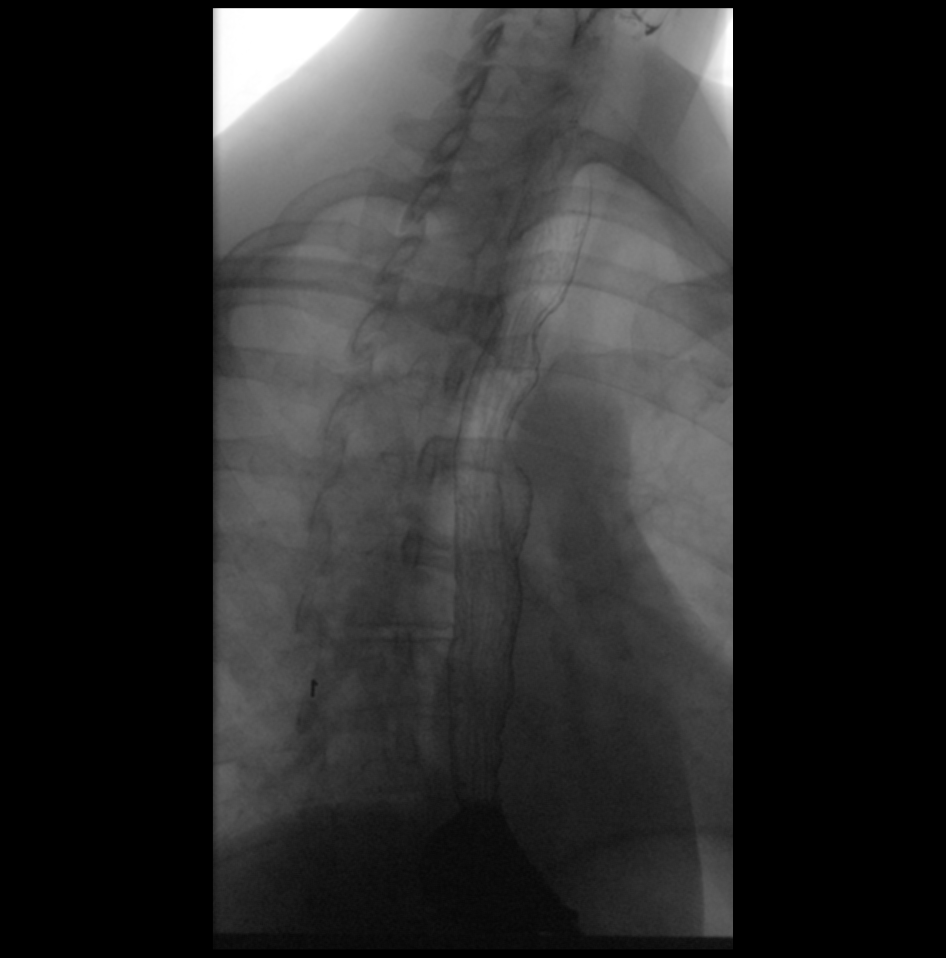

[Series 4: cp_standard · 0.51mm/px · 2 of 166 frames shown (4 of 9)]
[frame 84/166]
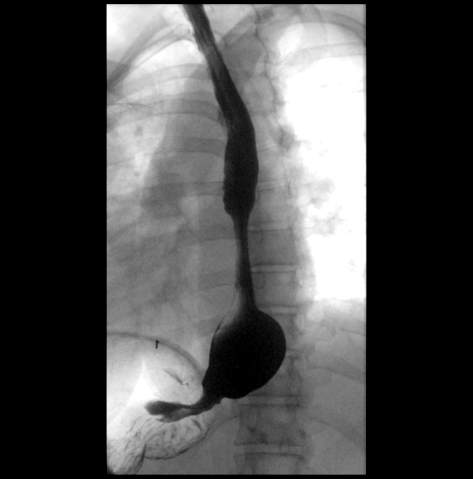
[frame 113/166]
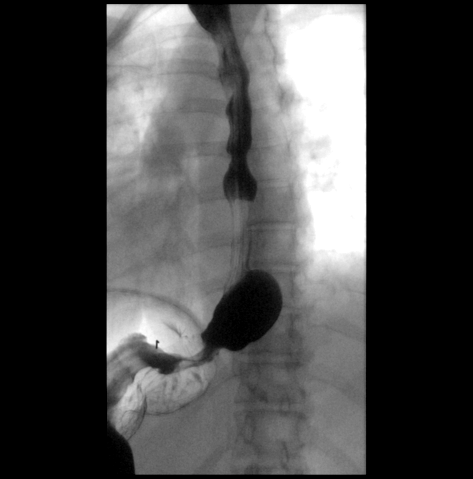

[Series 5: cp_standard · 0.51mm/px · 2 of 161 frames shown (5 of 9)]
[frame 81/161]
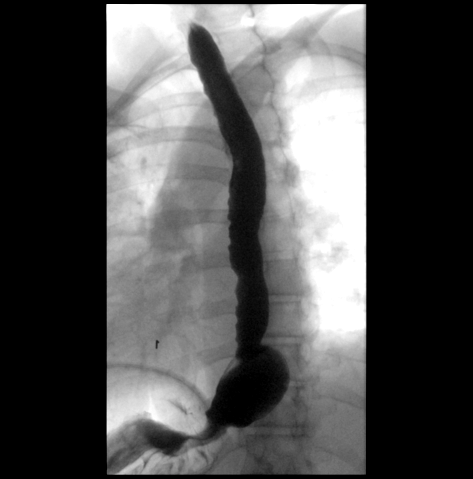
[frame 126/161]
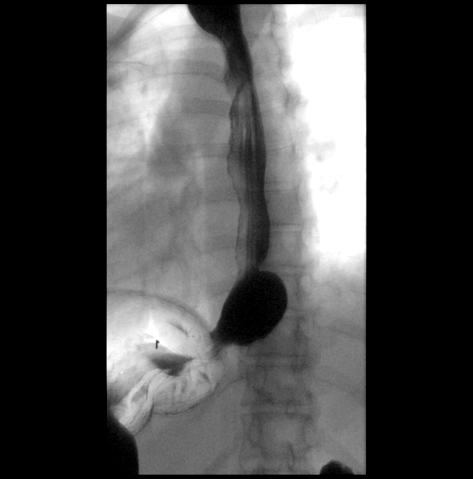

[Series 6: cp_standard · 0.51mm/px · 2 of 92 frames shown (6 of 9)]
[frame 47/92]
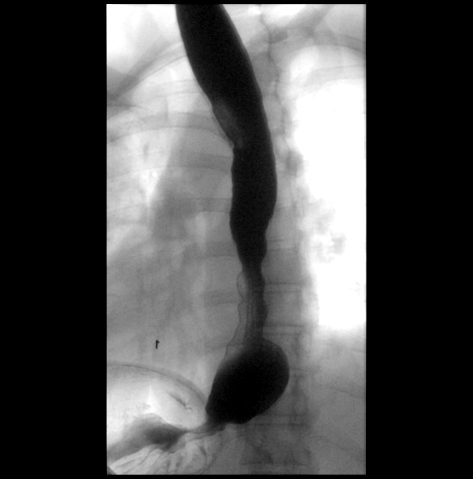
[frame 59/92]
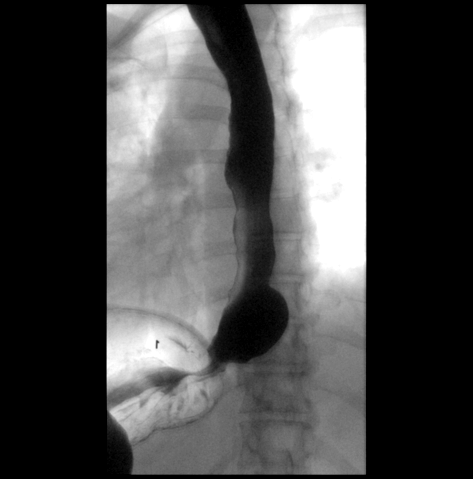

[Series 7: cp_standard · 0.51mm/px · 2 of 184 frames shown (7 of 9)]
[frame 28/184]
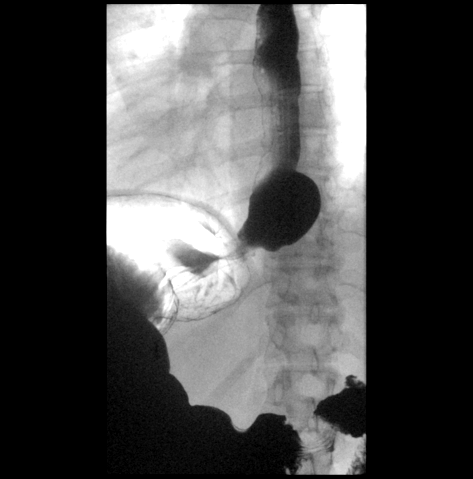
[frame 93/184]
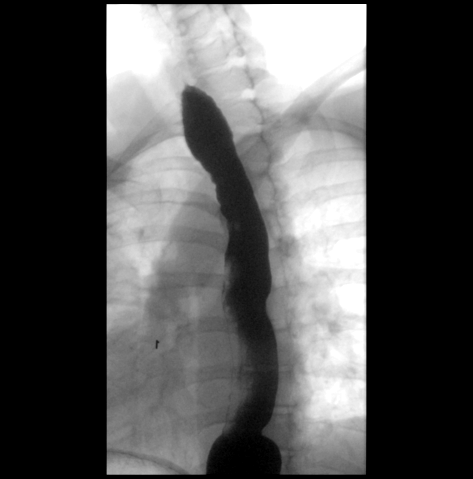

[Series 8: cp_standard · 0.34mm/px · 2 of 107 frames shown (8 of 9)]
[frame 17/107]
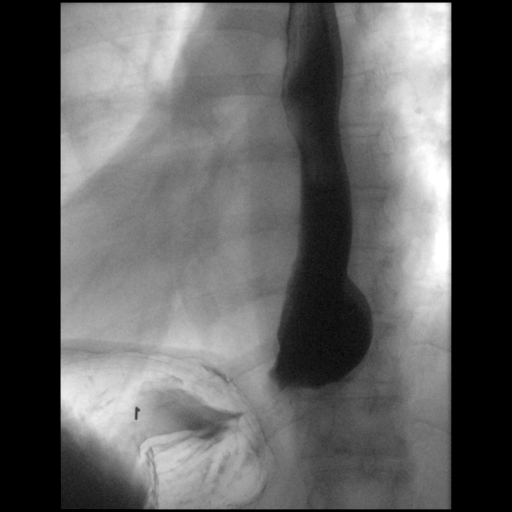
[frame 54/107]
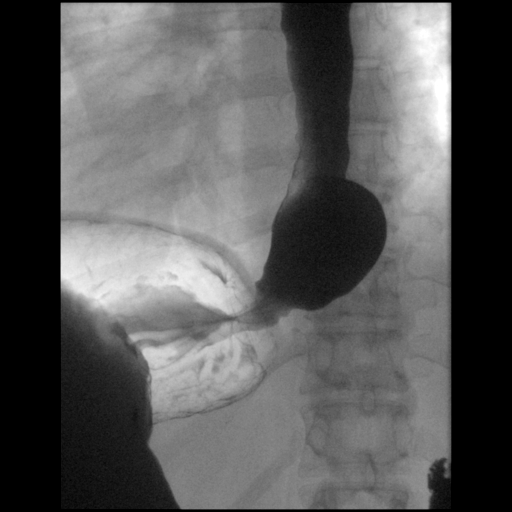

[Series 9: cp_standard · 0.25mm/px · 1 of 1 slices shown (9 of 9)]
[im 1/1]
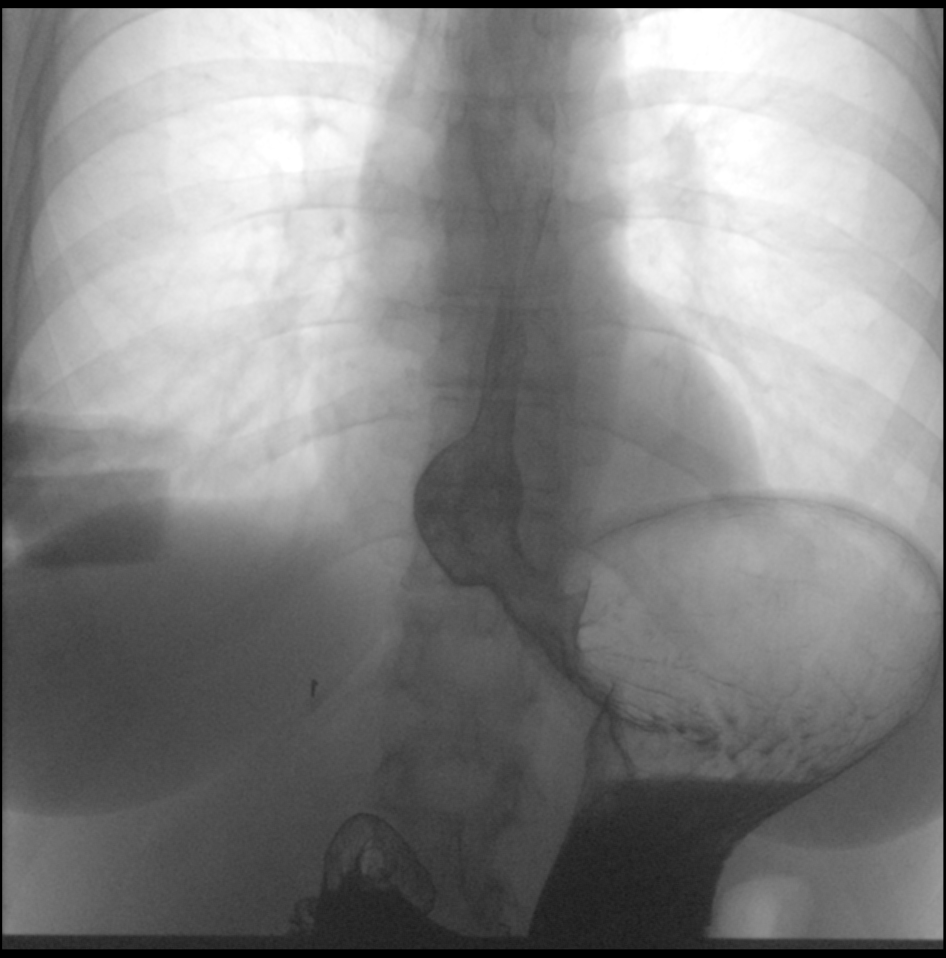

[16 of 24 positions shown; findings below may reference images not displayed]

FINDINGS: Hypopharyngeal portion of the exam is unremarkable.

Double contrast evaluation of the esophagus demonstrates no mucosal
abnormality.

Evaluation of esophageal peristalsis demonstrates proximal escape
waves with suboptimal primary peristaltic wave distally.

Full column evaluation of the esophagus demonstrates no persistent
narrowing or stricture. There is mild dilatation of the distal
esophagus, including on series 6, which could be postoperative.

A 13 mm barium tablet passes promptly.
IMPRESSION: 1. Given the clinical history, relatively mild esophageal
dysmotility, nonspecific. Distal esophageal mild dilatation,
possibly postoperative.
2. Prompt passage of a 13 mm barium tablet.

## 2022-09-02 ENCOUNTER — Encounter: Payer: Self-pay | Admitting: Gastroenterology

## 2022-09-07 ENCOUNTER — Encounter: Payer: Self-pay | Admitting: Gastroenterology

## 2022-10-24 ENCOUNTER — Ambulatory Visit (AMBULATORY_SURGERY_CENTER): Payer: Self-pay | Admitting: *Deleted

## 2022-10-24 VITALS — Ht 64.5 in | Wt 134.0 lb

## 2022-10-24 DIAGNOSIS — Z860100 Personal history of colon polyps, unspecified: Secondary | ICD-10-CM

## 2022-10-24 MED ORDER — NA SULFATE-K SULFATE-MG SULF 17.5-3.13-1.6 GM/177ML PO SOLN: 1.0000 | Freq: Once | ORAL | 0 refills | Status: AC

## 2022-10-24 NOTE — Progress Notes (Signed)

## 2022-11-01 ENCOUNTER — Encounter: Payer: Self-pay | Admitting: Gastroenterology

## 2022-11-14 ENCOUNTER — Ambulatory Visit (AMBULATORY_SURGERY_CENTER): Payer: BC Managed Care – PPO | Admitting: Gastroenterology

## 2022-11-14 ENCOUNTER — Encounter: Payer: Self-pay | Admitting: Gastroenterology

## 2022-11-14 VITALS — BP 118/79 | HR 57 | Temp 98.6°F | Resp 16 | Ht 64.5 in | Wt 134.0 lb

## 2022-11-14 DIAGNOSIS — Z86010 Personal history of colonic polyps: Secondary | ICD-10-CM

## 2022-11-14 DIAGNOSIS — Z09 Encounter for follow-up examination after completed treatment for conditions other than malignant neoplasm: Secondary | ICD-10-CM

## 2022-11-14 DIAGNOSIS — K635 Polyp of colon: Secondary | ICD-10-CM | POA: Diagnosis not present

## 2022-11-14 DIAGNOSIS — D123 Benign neoplasm of transverse colon: Secondary | ICD-10-CM | POA: Diagnosis not present

## 2022-11-14 MED ORDER — SODIUM CHLORIDE 0.9 % IV SOLN
500.0000 mL | Freq: Once | INTRAVENOUS | Status: DC
Start: 2022-11-14 — End: 2022-11-14

## 2022-11-14 NOTE — Op Note (Signed)
[facility] Patient Name: Misty Hensley Procedure Date: 11/14/2022 8:00 AM MRN: [id number] Endoscopist: Lynann Bologna , MD, [id number] Age: 52 Referring MD:  Date of Birth: September 15, 1970 Gender: Female Account #: [id number] Procedure:                Colonoscopy Indications:              High risk colon cancer surveillance: Personal                            history of colonic polyps Medicines:                Monitored Anesthesia Care Procedure:                Pre-Anesthesia Assessment:                           - Prior to the procedure, a History and Physical                            was performed, and patient medications and                            allergies were reviewed. The patient's tolerance of                            previous anesthesia was also reviewed. The risks                            and benefits of the procedure and the sedation                            options and risks were discussed with the patient.                            All questions were answered, and informed consent                            was obtained. Prior Anticoagulants: The patient has                            taken no anticoagulant or antiplatelet agents. ASA                            Grade Assessment: II - A patient with mild systemic                            disease. After reviewing the risks and benefits,                            the patient was deemed in satisfactory condition to                            undergo the procedure.  After obtaining informed consent, the colonoscope                            was passed under direct vision. Throughout the                            procedure, the patient's blood pressure, pulse, and                            oxygen saturations were monitored continuously. The                            Olympus PCF-[id number] (#[id number]) Colonoscope was                            introduced through the anus and advanced  to the the                            cecum, identified by appendiceal orifice and                            ileocecal valve. The colonoscopy was performed                            without difficulty. The patient tolerated the                            procedure well. The quality of the bowel                            preparation was good. The ileocecal valve,                            appendiceal orifice, and rectum were photographed. Scope In: 8:08:27 AM Scope Out: 8:21:15 AM Scope Withdrawal Time: 0 hours 9 minutes 7 seconds  Total Procedure Duration: 0 hours 12 minutes 48 seconds  Findings:                 A 4 mm polyp was found in the proximal transverse                            colon. The polyp was sessile. The polyp was removed                            with a cold snare. Resection and retrieval were                            complete.                           A few small-mouthed diverticula were found in the                            sigmoid colon and ascending colon.  Non-bleeding internal hemorrhoids were found during                            retroflexion. The hemorrhoids were small and Grade                            I (internal hemorrhoids that do not prolapse).                           The exam was otherwise without abnormality on                            direct and retroflexion views. Complications:            No immediate complications. Estimated Blood Loss:     Estimated blood loss: none. Impression:               - One 4 mm polyp in the proximal transverse colon,                            removed with a cold snare. Resected and retrieved.                           - Mild colonic diverticulosis.                           - Non-bleeding internal hemorrhoids.                           - The examination was otherwise normal on direct                            and retroflexion views.                           - The GI Genius  (intelligent endoscopy module),                            computer-aided polyp detection system powered by AI                            was utilized to detect colorectal polyps through                            enhanced visualization during colonoscopy. Recommendation:           - Patient has a contact number available for                            emergencies. The signs and symptoms of potential                            delayed complications were discussed with the                            patient. Return to normal activities tomorrow.  Written discharge instructions were provided to the                            patient.                           - Resume previous diet.                           - Continue present medications.                           - Await pathology results.                           - Repeat colonoscopy for surveillance based on                            pathology results.                           - The findings and recommendations were discussed                            with the patient's family. Lynann Bologna, MD 11/14/2022 8:26:22 AM This report has been signed electronically.

## 2022-11-14 NOTE — Patient Instructions (Signed)
   Handouts on polyps,diverticulosis,& hemorrhoids given to you today  Await pathology results on polyps removed      YOU HAD AN ENDOSCOPIC PROCEDURE TODAY AT THE [facility]:   Refer to the procedure report that was given to you for any specific questions about what was found during the examination.  If the procedure report does not answer your questions, please call your gastroenterologist to clarify.  If you requested that your care partner not be given the details of your procedure findings, then the procedure report has been included in a sealed envelope for you to review at your convenience later.  YOU SHOULD EXPECT: Some feelings of bloating in the abdomen. Passage of more gas than usual.  Walking can help get rid of the air that was put into your GI tract during the procedure and reduce the bloating. If you had a lower endoscopy (such as a colonoscopy or flexible sigmoidoscopy) you may notice spotting of blood in your stool or on the toilet paper. If you underwent a bowel prep for your procedure, you may not have a normal bowel movement for a few days.  Please Note:  You might notice some irritation and congestion in your nose or some drainage.  This is from the oxygen used during your procedure.  There is no need for concern and it should clear up in a day or so.  SYMPTOMS TO REPORT IMMEDIATELY:  Following lower endoscopy (colonoscopy or flexible sigmoidoscopy):  Excessive amounts of blood in the stool  Significant tenderness or worsening of abdominal pains  Swelling of the abdomen that is new, acute  Fever of 100F or higher   For urgent or emergent issues, a gastroenterologist can be reached at any hour by calling [phone]. Do not use MyChart messaging for urgent concerns.    DIET:  We do recommend a small meal at first, but then you may proceed to your regular diet.  Drink plenty of fluids but you should avoid alcoholic beverages for 24 hours.  ACTIVITY:   You should plan to take it easy for the rest of today and you should NOT DRIVE or use heavy machinery until tomorrow (because of the sedation medicines used during the test).    FOLLOW UP: Our staff will call the number listed on your records the next business day following your procedure.  We will call around 7:15- 8:00 am to check on you and address any questions or concerns that you may have regarding the information given to you following your procedure. If we do not reach you, we will leave a message.     If any biopsies were taken you will be contacted by phone or by letter within the next 1-3 weeks.  Please call us at [phone] if you have not heard about the biopsies in 3 weeks.    SIGNATURES/CONFIDENTIALITY: You and/or your care partner have signed paperwork which will be entered into your electronic medical record.  These signatures attest to the fact that that the information above on your After Visit Summary has been reviewed and is understood.  Full responsibility of the confidentiality of this discharge information lies with you and/or your care-partner.  

## 2022-11-14 NOTE — Progress Notes (Signed)
[facility] History and Physical   Primary Care Physician:  Iona Hansen, NP   Reason for Procedure:   CRC screening  Plan:    colon     HPI: Misty Hensley is a 52 y.o. female    Past Medical History:  Diagnosis Date   Adjustment disorder with depressed mood    Family history of breast cancer in mother    Genital herpes simplex    GERD (gastroesophageal reflux disease)    Hearing loss    Hyperlipidemia    Seizures (HCC)    LAST ONE 1982,UPDATED 10/24/22    Past Surgical History:  Procedure Laterality Date   achalasia surgery  2016   APPENDECTOMY     COLONOSCOPY     ESOPHAGEAL MANOMETRY  09/26/2011   Procedure: ESOPHAGEAL MANOMETRY (EM);  Surgeon: Shirley Friar, MD;  Location: [facility];  Service: Endoscopy;  Laterality: N/A;   ESOPHAGOGASTRODUODENOSCOPY  04/07/2014   [facility]   FOOT NEUROMA SURGERY     LAPAROSCOPIC HELLER MYOTOMY  05/30/2015   PLANTAR FASCIA RELEASE Bilateral    UPPER GASTROINTESTINAL ENDOSCOPY     X 2    Prior to Admission medications   Medication Sig Start Date End Date Taking? Authorizing Provider  ibuprofen (ADVIL) 600 MG tablet Take 600 mg by mouth as needed.   Yes [provider]  MAGNESIUM PO Take by mouth daily. TAKE 3 DAILY   Yes [provider]  Multiple Vitamin (MULTIVITAMIN) tablet Take 1 tablet by mouth daily.   Yes [provider]  esomeprazole (NEXIUM) 40 MG capsule Take 1 capsule (40 mg total) by mouth daily. Patient taking differently: Take 40 mg by mouth as needed. 07/11/19   Lynann Bologna, MD    Current Outpatient Medications  Medication Sig Dispense Refill   ibuprofen (ADVIL) 600 MG tablet Take 600 mg by mouth as needed.     MAGNESIUM PO Take by mouth daily. TAKE 3 DAILY     Multiple Vitamin (MULTIVITAMIN) tablet Take 1 tablet by mouth daily.     esomeprazole (NEXIUM) 40 MG capsule Take 1 capsule (40 mg total) by mouth daily. (Patient taking  differently: Take 40 mg by mouth as needed.) 30 capsule 6   Current Facility-Administered Medications  Medication Dose Route Frequency Provider Last Rate Last Admin   0.9 %  sodium chloride infusion  500 mL Intravenous Once Lynann Bologna, MD        Allergies as of 11/14/2022 - Review Complete 11/14/2022  Allergen Reaction Noted   Aspirin Other (See Comments) 01/17/2017   Penicillins Diarrhea 01/08/2014    Family History  Problem Relation Age of Onset   Breast cancer Mother 54   Coronary artery disease Father    Hyperlipidemia Father    Breast cancer Maternal Grandmother 66   Hyperlipidemia Paternal Grandfather    Colon cancer Neg Hx    Esophageal cancer Neg Hx    Colon polyps Neg Hx    Crohn's disease Neg Hx    Rectal cancer Neg Hx    Stomach cancer Neg Hx    Ulcerative colitis Neg Hx     Social History   Socioeconomic History   Marital status: Married    Spouse name: Not on file   Number of children: 1   Years of education: Not on file   Highest education level: Not on file  Occupational History   Not on file  Tobacco Use   Smoking status: Never   Smokeless tobacco:  Never  Vaping Use   Vaping Use: Never used  Substance and Sexual Activity   Alcohol use: No   Drug use: No   Sexual activity: Not on file  Other Topics Concern   Not on file  Social History Narrative   Not on file   Social Determinants of Health   Financial Resource Strain: Not on file  Food Insecurity: Not on file  Transportation Needs: Not on file  Physical Activity: Not on file  Stress: Not on file  Social Connections: Not on file  Intimate Partner Violence: Not on file    Review of Systems: Positive for none All other review of systems negative except as mentioned in the HPI.  Physical Exam: Vital signs in last 24 hours: @VSRANGES @   General:   Alert,  Well-developed, well-nourished, pleasant and cooperative in NAD Lungs:  Clear throughout to auscultation.   Heart:  Regular  rate and rhythm; no murmurs, clicks, rubs,  or gallops. Abdomen:  Soft, nontender and nondistended. Normal bowel sounds.   Neuro/Psych:  Alert and cooperative. Normal mood and affect. A and O x 3    No significant changes were identified.  The patient continues to be an appropriate candidate for the planned procedure and anesthesia.   Edman Circle, MD. [facility] 11/14/2022 8:03 AM@

## 2022-11-14 NOTE — Progress Notes (Signed)
To PACU, VSS. Report to [email or web address] 

## 2022-11-14 NOTE — Progress Notes (Signed)
VS completed by DT.  Pt's states no medical or surgical changes since previsit or office visit.  

## 2022-11-14 NOTE — Progress Notes (Signed)
Called to room to assist during endoscopic procedure.  Patient ID and intended procedure confirmed with present staff. Received instructions for my participation in the procedure from the performing physician.  

## 2022-11-15 ENCOUNTER — Telehealth: Payer: Self-pay | Admitting: *Deleted

## 2022-11-15 NOTE — Telephone Encounter (Signed)
  Follow up Call-     11/14/2022    7:26 AM  Call back number  Post procedure Call Back phone  # [phone]  Permission to leave phone message Yes     Patient questions:  Do you have a fever, pain , or abdominal swelling? No. Pain Score  0 *  Have you tolerated food without any problems? Yes.    Have you been able to return to your normal activities? Yes.    Do you have any questions about your discharge instructions: Diet   No. Medications  No. Follow up visit  No.  Do you have questions or concerns about your Care? No.  Actions: * If pain score is 4 or above: No action needed, pain <4.

## 2022-11-16 ENCOUNTER — Encounter: Payer: Self-pay | Admitting: Gastroenterology

## 2023-10-12 ENCOUNTER — Other Ambulatory Visit: Payer: Self-pay | Admitting: Dentistry

## 2023-10-12 DIAGNOSIS — M26632 Articular disc disorder of left temporomandibular joint: Secondary | ICD-10-CM

## 2023-10-23 ENCOUNTER — Ambulatory Visit
Admission: RE | Admit: 2023-10-23 | Discharge: 2023-10-23 | Discharge status: Home / Self Care | Payer: Self-pay | Source: Ambulatory Visit | Attending: Dentistry | Admitting: Dentistry

## 2023-10-23 DIAGNOSIS — M26632 Articular disc disorder of left temporomandibular joint: Secondary | ICD-10-CM

## 2024-03-27 ENCOUNTER — Encounter (HOSPITAL_BASED_OUTPATIENT_CLINIC_OR_DEPARTMENT_OTHER): Payer: Self-pay | Admitting: Emergency Medicine

## 2024-03-27 ENCOUNTER — Other Ambulatory Visit: Payer: Self-pay

## 2024-03-27 ENCOUNTER — Emergency Department (HOSPITAL_BASED_OUTPATIENT_CLINIC_OR_DEPARTMENT_OTHER)

## 2024-03-27 ENCOUNTER — Emergency Department (HOSPITAL_BASED_OUTPATIENT_CLINIC_OR_DEPARTMENT_OTHER)
Admission: EM | Admit: 2024-03-27 | Discharge: 2024-03-27 | Disposition: A | Discharge status: Home / Self Care | Attending: Emergency Medicine | Admitting: Emergency Medicine

## 2024-03-27 DIAGNOSIS — R0789 Other chest pain: Secondary | ICD-10-CM | POA: Insufficient documentation

## 2024-03-27 DIAGNOSIS — R079 Chest pain, unspecified: Secondary | ICD-10-CM

## 2024-03-27 LAB — BASIC METABOLIC PANEL WITH GFR
Anion gap: 11 (ref 5–15)
BUN: 13 mg/dL (ref 6–20)
CO2: 28 mmol/L (ref 22–32)
Calcium: 9.2 mg/dL (ref 8.9–10.3)
Chloride: 103 mmol/L (ref 98–111)
Creatinine, Ser: 0.85 mg/dL (ref 0.44–1.00)
GFR, Estimated: >60 mL/min (ref >60)
Glucose, Bld: 102 mg/dL — ABNORMAL HIGH (ref 70–99)
Potassium: 3.7 mmol/L (ref 3.5–5.1)
Sodium: 142 mmol/L (ref 135–145)

## 2024-03-27 LAB — D-DIMER, QUANTITATIVE: D-Dimer, Quant: 0.35 ug{FEU}/mL (ref 0.00–0.50)

## 2024-03-27 LAB — LIPASE, BLOOD: Lipase: 25 U/L (ref 11–51)

## 2024-03-27 LAB — CBC
HCT: 38.6 % (ref 36.0–46.0)
Hemoglobin: 12.9 g/dL (ref 12.0–15.0)
MCH: 30.9 pg (ref 26.0–34.0)
MCHC: 33.4 g/dL (ref 30.0–36.0)
MCV: 92.6 fL (ref 80.0–100.0)
Platelets: 319 K/uL (ref 150–400)
RBC: 4.17 MIL/uL (ref 3.87–5.11)
RDW: 12.4 % (ref 11.5–15.5)
WBC: 5.4 K/uL (ref 4.0–10.5)
nRBC: 0 % (ref 0.0–0.2)

## 2024-03-27 LAB — TROPONIN T, HIGH SENSITIVITY
Troponin T High Sensitivity: <15 ng/L (ref 0–19)
Troponin T High Sensitivity: <15 ng/L (ref 0–19)

## 2024-03-27 MED ORDER — PANTOPRAZOLE SODIUM 40 MG IV SOLR
40.0000 mg | Freq: Once | INTRAVENOUS | Status: AC
Start: 2024-03-27 — End: 2024-03-27
  Administered 2024-03-27: 40 mg via INTRAVENOUS
  Filled 2024-03-27: qty 10

## 2024-03-27 MED ORDER — LIDOCAINE VISCOUS HCL 2 % MT SOLN
15.0000 mL | Freq: Once | OROMUCOSAL | Status: AC
  Administered 2024-03-27: 15 mL via ORAL
  Filled 2024-03-27: qty 15

## 2024-03-27 MED ORDER — ALUM & MAG HYDROXIDE-SIMETH 200-200-20 MG/5ML PO SUSP
30.0000 mL | Freq: Once | ORAL | Status: AC
  Administered 2024-03-27: 30 mL via ORAL
  Filled 2024-03-27: qty 30

## 2024-03-27 NOTE — Discharge Instructions (Signed)
 Misty Hensley  Thank you for allowing us  to take care of you today.  You came to the Emergency Department today because you had chest pressure that was different than usual reflux pain.  Here in the emergency department your heart numbers were undetectable, your blood clot risk factor number was negative.  Your liver, kidney, pancreas numbers were normal, and your chest x-ray and EKG are reassuring.  It is unclear what exactly is causing your pain, however your workup is reassuring against emergencies causing your pain.  If you are not on a proton pump inhibitor (PPI) at home already (you should check your [employer] medication to see whether or not it says proton pump inhibitor on the packaging) we recommend taking a 2-week course of proton pump inhibitor to help with your reflux.  To-Do: 1. Please follow-up with your primary doctor within 1 week / as soon as possible.   Please return to the Emergency Department or call 911 if you experience have worsening of your symptoms, or do not get better, chest pain, shortness of breath, severe or significantly worsening pain, high fever, severe confusion, pass out or have any reason to think that you need emergency medical care.   We hope you feel better soon.   [name] Later, MD Department of Emergency Medicine [facility]

## 2024-03-27 NOTE — ED Provider Notes (Signed)
 [place] EMERGENCY DEPARTMENT AT [facility] Provider Note   CSN: [id number] Arrival date & time: 03/27/24  9357     History Chief Complaint  Patient presents with   Chest Pain    HPI: Misty Hensley is a 53 y.o. female with history pertinent for depression, GERD, hyperlipidemia, seizure disorder, achalasia who presents complaining of chest pain. Patient arrived via POV. History provided by patient.  No interpreter required during this encounter.  Patient presents for a substernal chest pressure.  Reports that typically her reflux presents more as a burning pain rather than pressure or other when this initiated last night she initially felt that it was related to her reflux.  Reports that she this morning she had worsened chest pressure.  No specific aggravating or alleviating factors.  Reports that she waited for approximately an hour at home.  Reports that while she was in the waiting room that she had spontaneous resolution of symptoms.  Patient denies any associated fever, chills, shortness of breath, nausea, vomiting, diarrhea.  Patient's recorded medical, surgical, social, medication list and allergies were reviewed in the Snapshot window as part of the initial history.   Prior to Admission medications   Medication Sig Start Date End Date Taking? Authorizing Provider  esomeprazole  (NEXIUM ) 40 MG capsule Take 1 capsule (40 mg total) by mouth daily. Patient taking differently: Take 40 mg by mouth as needed. 07/11/19   Charlanne Groom, MD  ibuprofen (ADVIL) 600 MG tablet Take 600 mg by mouth as needed.    [provider]  MAGNESIUM  PO Take by mouth daily. TAKE 3 DAILY    [provider]  Multiple Vitamin (MULTIVITAMIN) tablet Take 1 tablet by mouth daily.    [provider]     Allergies: Aspirin and Penicillins   Review of Systems   ROS as per HPI  Physical Exam Updated Vital Signs BP 125/82   Pulse 79   Temp 97.8 F (36.6 C) (Oral)    Resp 14   Ht 5' 4.5 (1.638 m)   Wt 62.6 kg   SpO2 99%   BMI 23.32 kg/m  Physical Exam Vitals and nursing note reviewed.  Constitutional:      General: She is not in acute distress.    Appearance: She is well-developed.  HENT:     Head: Normocephalic and atraumatic.  Eyes:     Conjunctiva/sclera: Conjunctivae normal.  Cardiovascular:     Rate and Rhythm: Normal rate and regular rhythm.     Pulses:          Radial pulses are 2+ on the right side and 2+ on the left side.     Heart sounds: No murmur heard. Pulmonary:     Effort: Pulmonary effort is normal. No respiratory distress.     Breath sounds: Normal breath sounds.  Abdominal:     Palpations: Abdomen is soft.     Tenderness: There is no abdominal tenderness.  Musculoskeletal:        General: No swelling.     Cervical back: Neck supple.  Skin:    General: Skin is warm and dry.     Capillary Refill: Capillary refill takes less than 2 seconds.  Neurological:     Mental Status: She is alert.  Psychiatric:        Mood and Affect: Mood normal.     ED Course/ Medical Decision Making/ A&P    Procedures Procedures   Medications Ordered in ED Medications  pantoprazole  (PROTONIX ) injection  40 mg (40 mg Intravenous Given 03/27/24 0858)  alum & mag hydroxide-simeth (MAALOX/MYLANTA) 200-200-20 MG/5ML suspension 30 mL (30 mLs Oral Given 03/27/24 0858)    And  lidocaine  (XYLOCAINE ) 2 % viscous mouth solution 15 mL (15 mLs Oral Given 03/27/24 0858)    Medical Decision Making:   Misty Hensley is a 53 y.o. female who presents for chest discomfort as per above.  Physical exam is pertinent for no focal abnormalities.   The differential includes but is not limited to ACS, arrhythmia, pericardial tamponade, pericarditis, myocarditis, pneumonia, pneumothorax, esophageal, tear, perforated abdominal viscous, pulmonary embolism, aortic dissection, costochondritis, musculoskeletal chest wall pain, GERD, pancreatitis.  Independent  historian: None  External data reviewed: No pertinent external data  Labs: Ordered, Independent interpretation, and Details: Initial delta troponin undetectable.  D-dimer WNL.  CBC without leukocytosis, anemia, thrombocytopenia.  Lipase WNL.  BMP WNL  Radiology: Ordered, Independent interpretation, Details: Chest x-ray without focal airspace opacification, cardiomediastinal silhouette derangement, pneumothorax, pleural effusion, bony derangement, and All images reviewed independently.  Agree with radiology report at this time.   DG Chest 2 View Result Date: 03/27/2024 CLINICAL DATA:  53 year old female with midline chest pain onset this morning. EXAM: CHEST - 2 VIEW COMPARISON:  Cardiac CT 03/22/2023 and earlier. FINDINGS: PA and lateral views. Lung volumes and mediastinal contours are within normal limits. Visualized tracheal air column is within normal limits. EKG button artifact most notably in the right upper chest. Lungs appear clear. No pneumothorax or pleural effusion. No acute osseous abnormality identified. Mild levoconvex thoracic scoliosis. Negative visible bowel gas. IMPRESSION: No acute cardiopulmonary abnormality. Electronically Signed   By: VEAR Hurst M.D.   On: 03/27/2024 07:26    EKG/Medicine tests: Ordered and Independent interpretation EKG Interpretation Date/Time:  Wednesday March 27 2024 06:50:30 EDT Ventricular Rate:  78 PR Interval:  166 QRS Duration:  95 QT Interval:  413 QTC Calculation: 471 R Axis:   13  Text Interpretation: Sinus rhythm Consider left atrial enlargement Low voltage, precordial leads RSR' in V1 or V2, right VCD or RVH No significant change since last tracing 17-Jan-2017 Confirmed by Rogelia Satterfield ([id number]) on 03/27/2024 7:29:33 AM  Interventions: Maalox, Protonix   See the EMR for full details regarding lab and imaging results.  The ECG reveals no anatomical ischemia representing STEMI, New-Onset Arrhythmia, or ischemic equivalent. She has been  risk stratified with a HEAR score of 3. Initial troponin is undetectable; delta troponin is undetectable.  The patient's presentation, the patient being hemodynamically stable, and the ECG are not consistent with Pericardial Tamponade. The patient's pain is not positional. This in conjunction with the lack of PR depressions and ST elevations on the ECG are reassuring against Pericarditis. The patient's non-elevated troponin and ECG are also inconsistent with Myocarditis.  Doubt pancreatitis given lipase WNL.  The CXR is unremarkable for focal airspace disease.  The patient is afebrile and denies productive cough.  Therefore, I do not suspect Pneumonia. There is no evidence of Pneumothorax on physical exam or on the CXR. CXR shows no evidence of Esophageal Tear and there is no recent intractable emesis or esophageal instrumentation. There is no peritonitis or free air on CXR worrisome for a Perforated Abdominal Viscous.  Pulmonary Embolism is on the differential. The patient is at  risk via the Revised Geneva Criteria. Therefore, we will further risk stratify the patient with a d-dimer.  This was WNL. Therefore, a CTA not indicated.  The patient's pain is not tearing and it does not radiate to  back. Pulses are present bilaterally in both the upper and lower extremities. CXR does not show a widened mediastinum. I have a very low suspicion for Aortic Dissection.  Patient reevaluated, continues to be pain-free.  Discussed that etiology of symptoms is not fully elucidated, however reassuring workup, exam, spontaneous resolution of symptoms is reassuring against underlying emergent pathology.  Patient expresses understanding, comfortable with discharge.  Presentation is most consistent with acute complicated illness and I did consider and rule out acute life/limb-threatening illness  Discussion of management or test interpretations with external provider(s): Not indicated  Risk Drugs:OTC drugs and  Prescription drug management  Disposition: DISCHARGE: I believe that the patient is safe for discharge home with outpatient follow-up. Patient was informed of all pertinent physical exam, laboratory, and imaging findings. Patient's suspected etiology of their symptom presentation was discussed with the patient and all questions were answered. We discussed following up with PCP. I provided thorough ED return precautions. The patient feels safe and comfortable with this plan.   MDM generated using voice dictation software and may contain dictation errors.  Please contact me for any clarification or with any questions.  Clinical Impression:  1. Chest pain, unspecified type      Discharge   Final Clinical Impression(s) / ED Diagnoses Final diagnoses:  Chest pain, unspecified type    Rx / DC Orders ED Discharge Orders     None        Rogelia Jerilynn RAMAN, MD 03/27/24 1011

## 2024-03-27 NOTE — ED Triage Notes (Signed)
 Chest pain X 1 hour thought it was acid reflux but not getting better with meds.
